# Patient Record
Sex: Male | Born: 1955 | Hispanic: No | Marital: Married | Smoking: Former smoker
Health system: Southern US, Community
[De-identification: ages and names within clinical notes are randomized; demographics above are authoritative.]

## PROBLEM LIST (undated history)

## (undated) DIAGNOSIS — K219 Gastro-esophageal reflux disease without esophagitis: Secondary | ICD-10-CM

## (undated) DIAGNOSIS — E785 Hyperlipidemia, unspecified: Secondary | ICD-10-CM

## (undated) DIAGNOSIS — I10 Essential (primary) hypertension: Secondary | ICD-10-CM

## (undated) DIAGNOSIS — M1990 Unspecified osteoarthritis, unspecified site: Secondary | ICD-10-CM

## (undated) DIAGNOSIS — E119 Type 2 diabetes mellitus without complications: Secondary | ICD-10-CM

## (undated) HISTORY — PX: COLONOSCOPY: SHX174

---

## 2004-10-06 ENCOUNTER — Ambulatory Visit: Payer: Self-pay | Admitting: Surgery

## 2005-11-15 IMAGING — CR DG CHEST 2V
1 series · 2 of 2 positions shown · non-contrast
Comparison: none

REASON FOR EXAM: cough and expectoration (telephone results to physician)
COMMENTS:

PROCEDURE:     DXR - DXR CHEST PA (OR AP) AND LATERAL  - October 06, 2004  [DATE]
RESULT:       PA and lateral views of the chest show the lungs to be clear
and well expanded without evidence of infiltrates, effusions or mass.

[Series 1: view not recorded · 0.17mm/px · 2 of 2 slices shown]
[im 1/2]
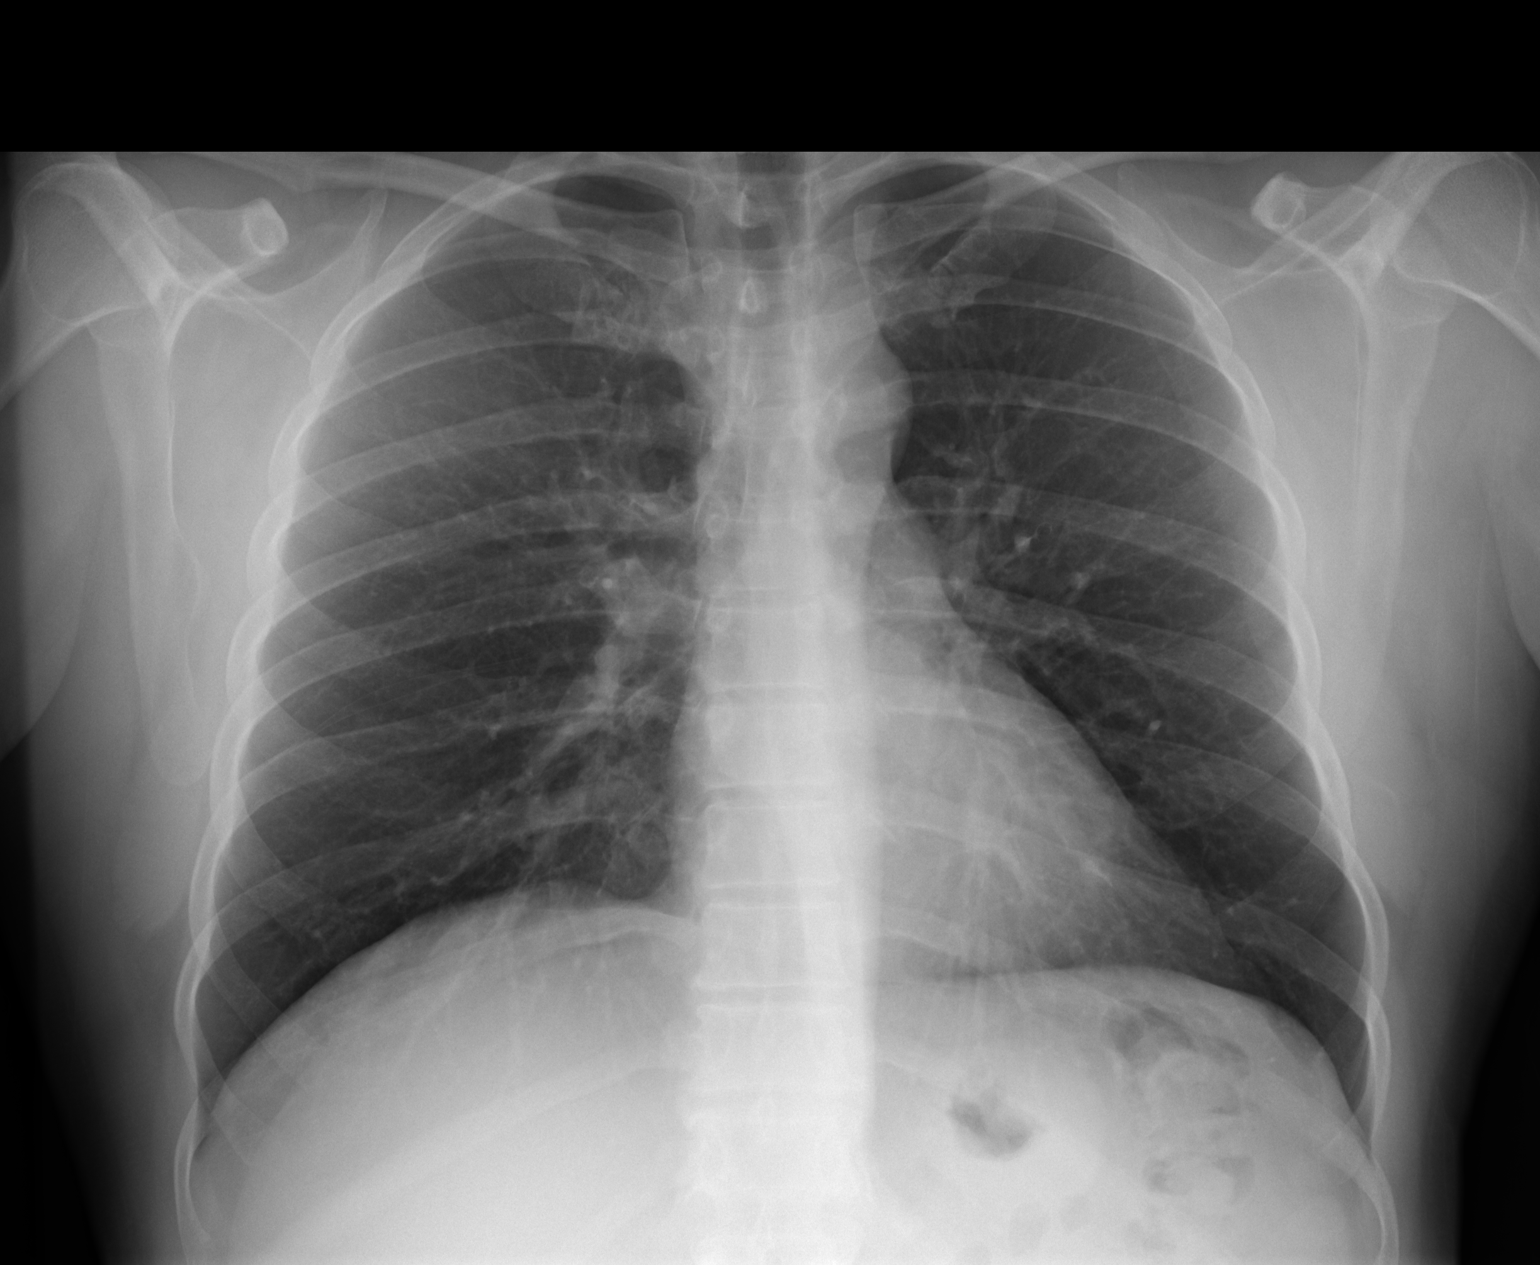
[im 2/2]
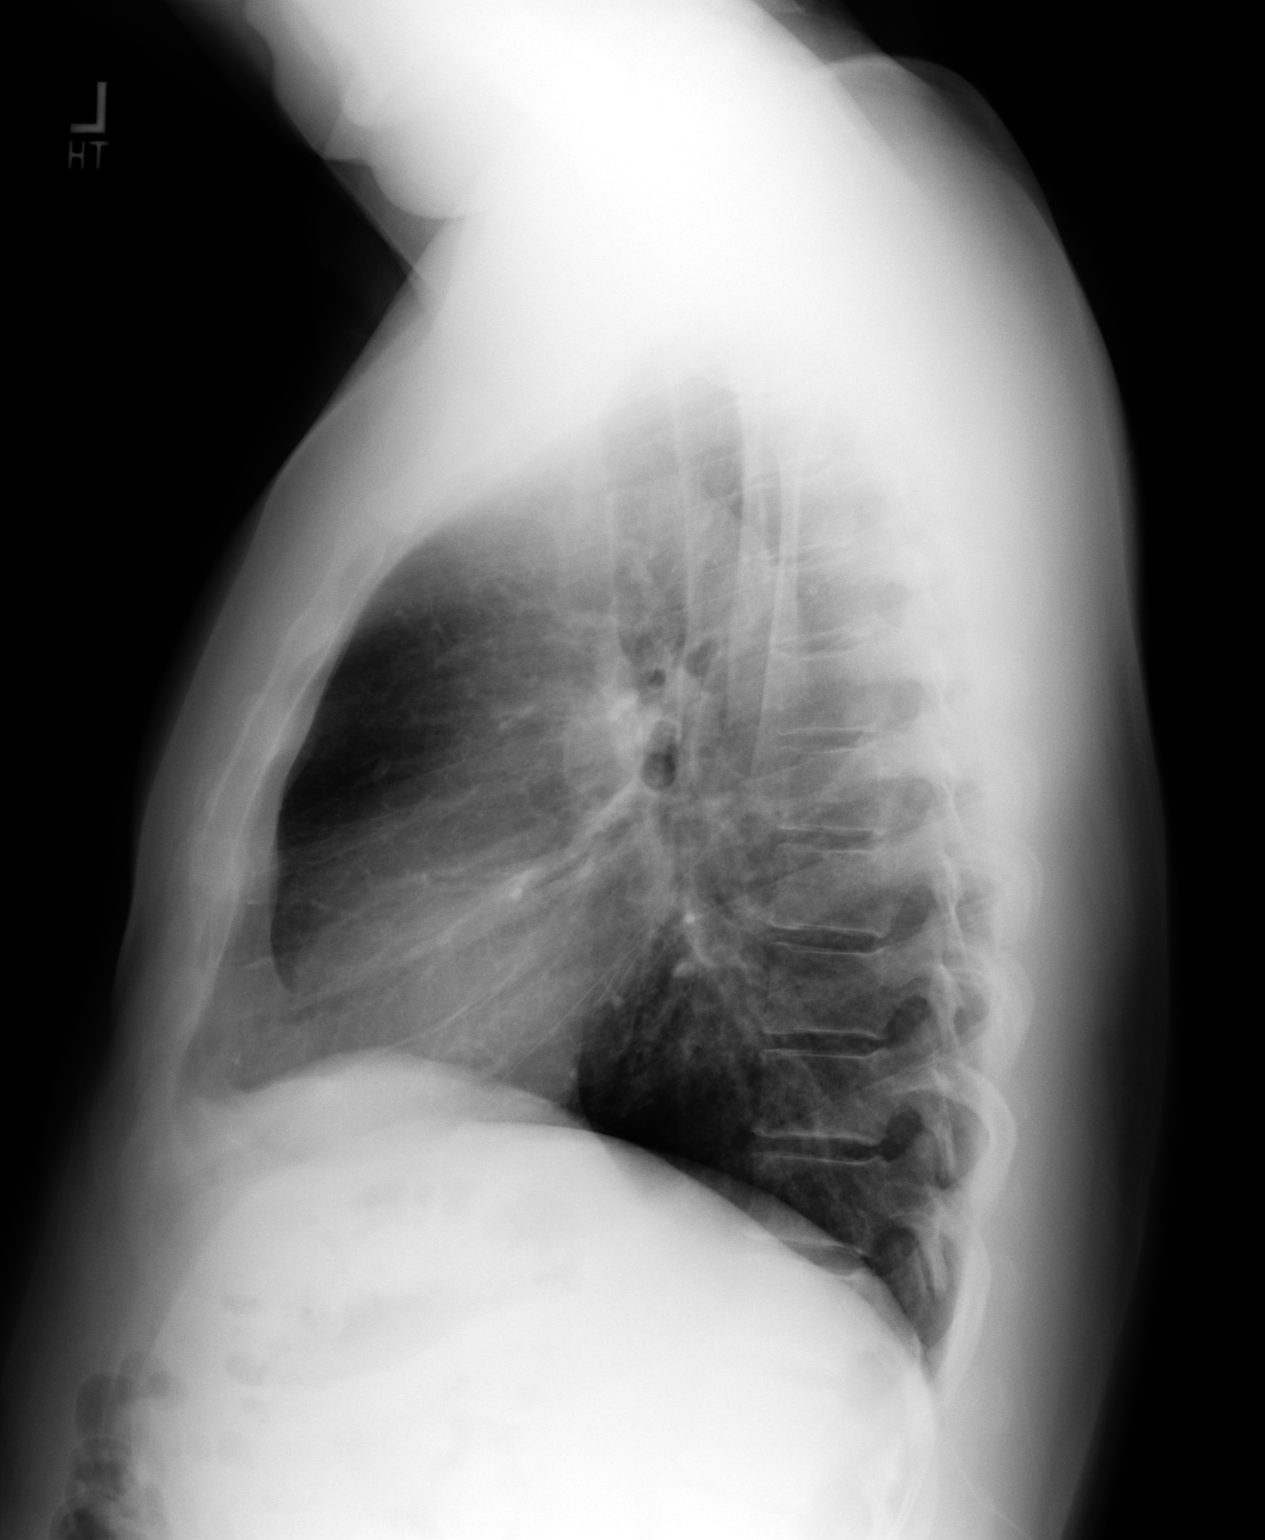

[2 of 2 positions shown; findings below may reference images not displayed]

IMPRESSION: No acute pulmonary disease.

## 2006-02-01 ENCOUNTER — Ambulatory Visit (HOSPITAL_COMMUNITY): Admission: RE | Admit: 2006-02-01 | Discharge: 2006-02-01 | Payer: Self-pay | Admitting: Orthopedic Surgery

## 2007-03-13 IMAGING — CR DG ORBITS FOR FOREIGN BODY
2 series · 2 of 2 positions shown · non-contrast
Comparison: none

CLINICAL DATA: Metal exposure.   Pre-MRI orbital screening.  
 ORBITS FOR FOREIGN BODY -   VIEW:
 There is no evidence of metallic foreign body within the orbits.  No significant bone abnormality identified.

[view not recorded (1 of 2)]
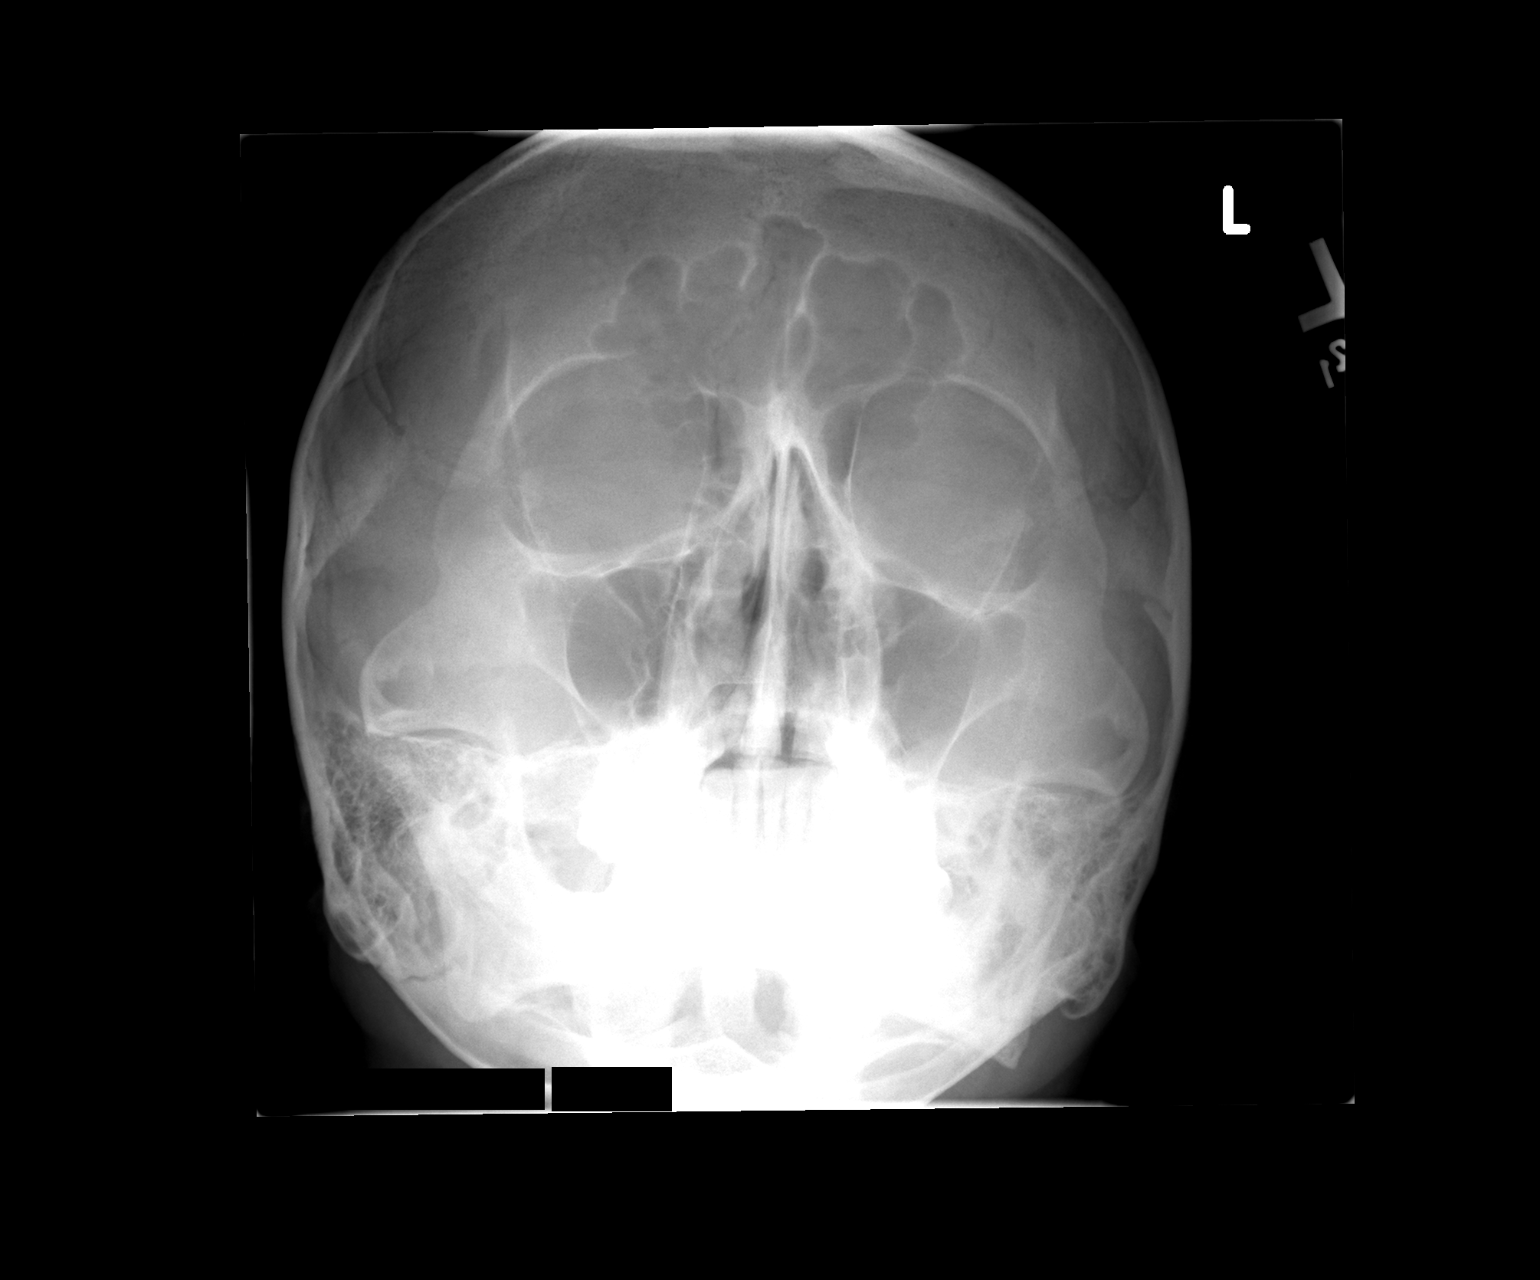

[view not recorded (2 of 2)]
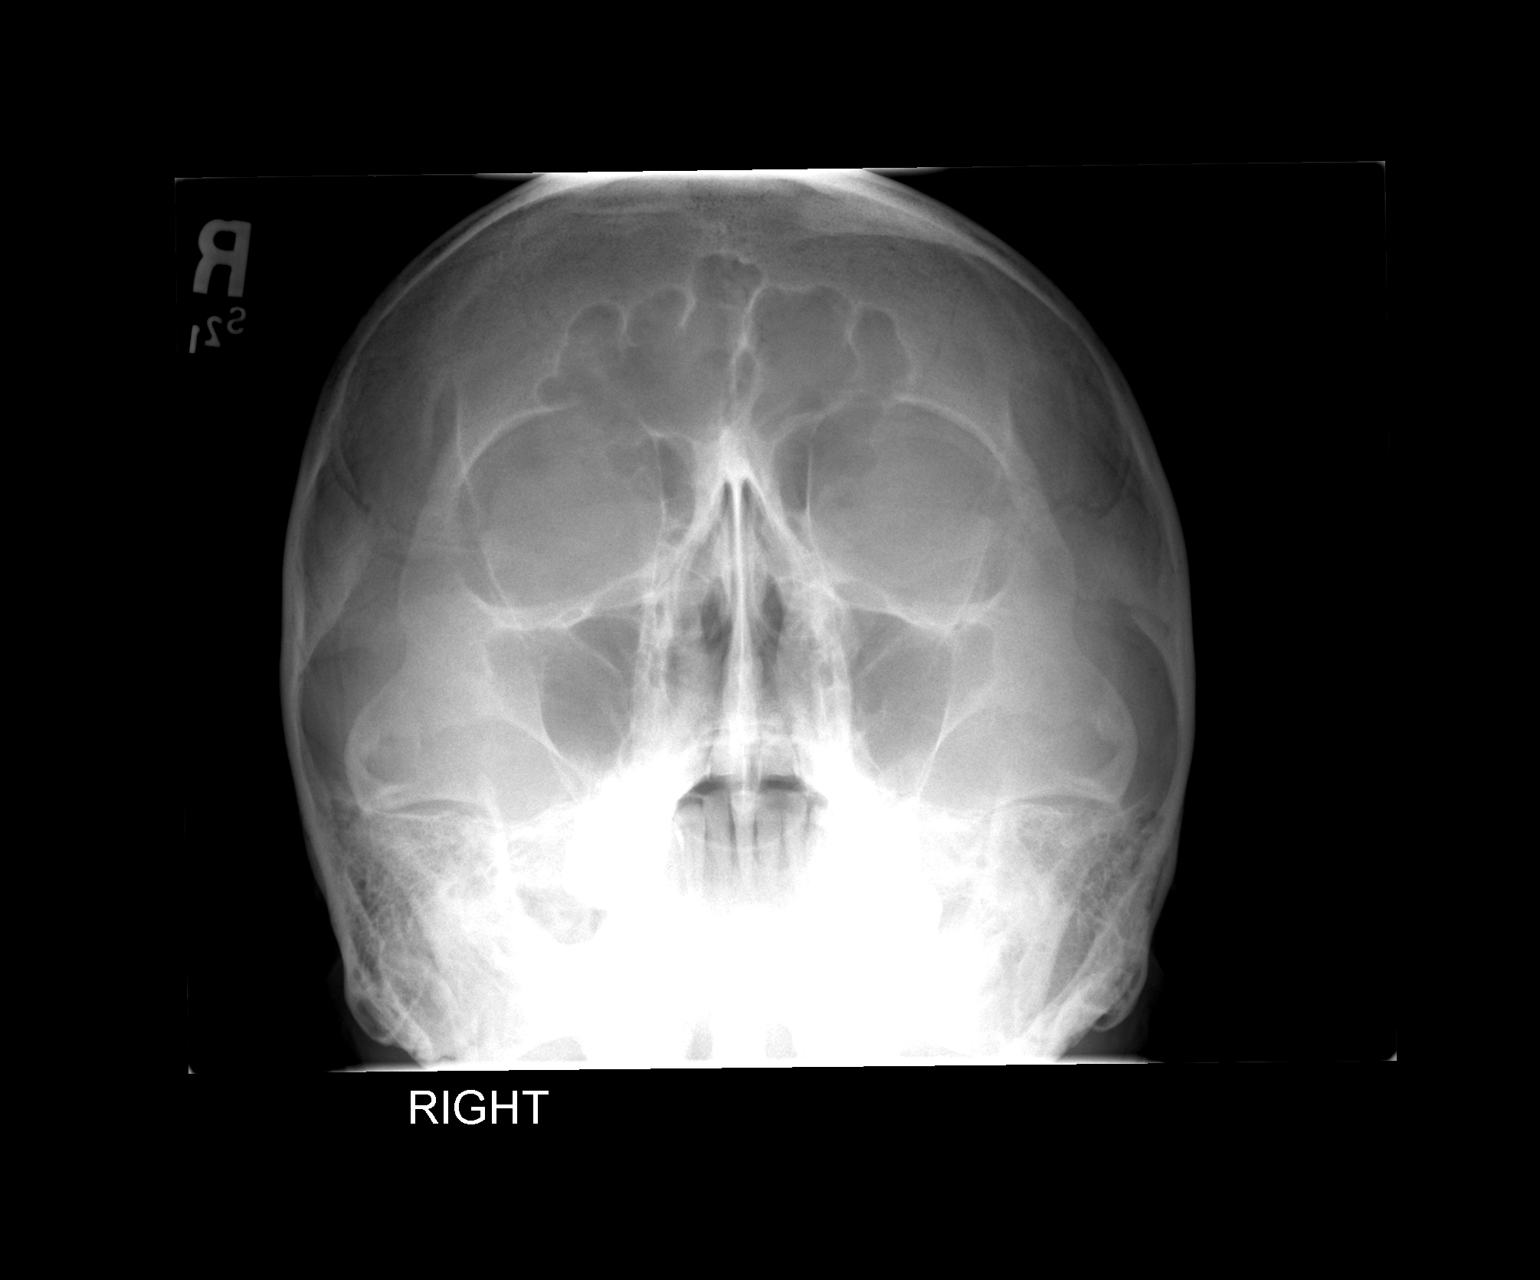

[2 of 2 positions shown; findings below may reference images not displayed]

IMPRESSION: No evidence of metallic foreign body within the orbits.

## 2007-07-25 ENCOUNTER — Ambulatory Visit: Payer: Self-pay | Admitting: Gastroenterology

## 2016-05-24 ENCOUNTER — Encounter (HOSPITAL_COMMUNITY): Payer: Self-pay | Admitting: Emergency Medicine

## 2016-05-24 ENCOUNTER — Emergency Department (HOSPITAL_COMMUNITY)
Admission: EM | Admit: 2016-05-24 | Discharge: 2016-05-24 | Disposition: A | Discharge status: Home / Self Care | Payer: BLUE CROSS/BLUE SHIELD | Attending: Emergency Medicine | Admitting: Emergency Medicine

## 2016-05-24 DIAGNOSIS — R112 Nausea with vomiting, unspecified: Secondary | ICD-10-CM | POA: Insufficient documentation

## 2016-05-24 DIAGNOSIS — E119 Type 2 diabetes mellitus without complications: Secondary | ICD-10-CM | POA: Insufficient documentation

## 2016-05-24 DIAGNOSIS — Z79899 Other long term (current) drug therapy: Secondary | ICD-10-CM | POA: Diagnosis not present

## 2016-05-24 DIAGNOSIS — I10 Essential (primary) hypertension: Secondary | ICD-10-CM | POA: Insufficient documentation

## 2016-05-24 DIAGNOSIS — Z7984 Long term (current) use of oral hypoglycemic drugs: Secondary | ICD-10-CM | POA: Diagnosis not present

## 2016-05-24 HISTORY — DX: Type 2 diabetes mellitus without complications: E11.9

## 2016-05-24 HISTORY — DX: Essential (primary) hypertension: I10

## 2016-05-24 HISTORY — DX: Hyperlipidemia, unspecified: E78.5

## 2016-05-24 HISTORY — DX: Unspecified osteoarthritis, unspecified site: M19.90

## 2016-05-24 LAB — COMPREHENSIVE METABOLIC PANEL
ALBUMIN: 5 g/dL (ref 3.5–5.0)
ALT: 56 U/L (ref 17–63)
ANION GAP: 13 (ref 5–15)
AST: 47 U/L — ABNORMAL HIGH (ref 15–41)
Alkaline Phosphatase: 51 U/L (ref 38–126)
BUN: 22 mg/dL — ABNORMAL HIGH (ref 6–20)
CHLORIDE: 101 mmol/L (ref 101–111)
CO2: 22 mmol/L (ref 22–32)
CREATININE: 0.65 mg/dL (ref 0.61–1.24)
Calcium: 9.8 mg/dL (ref 8.9–10.3)
GFR calc non Af Amer: >60 mL/min (ref >60)
GLUCOSE: 196 mg/dL — AB (ref 65–99)
Potassium: 4.2 mmol/L (ref 3.5–5.1)
SODIUM: 136 mmol/L (ref 135–145)
Total Bilirubin: 0.6 mg/dL (ref 0.3–1.2)
Total Protein: 8.6 g/dL — ABNORMAL HIGH (ref 6.5–8.1)

## 2016-05-24 LAB — CBC WITH DIFFERENTIAL/PLATELET
Basophils Absolute: 0 10*3/uL (ref 0.0–0.1)
Basophils Relative: 0 %
Eosinophils Absolute: 0.1 10*3/uL (ref 0.0–0.7)
Eosinophils Relative: 1 %
HCT: 46.9 % (ref 39.0–52.0)
HEMOGLOBIN: 16.3 g/dL (ref 13.0–17.0)
LYMPHS ABS: 2.2 10*3/uL (ref 0.7–4.0)
LYMPHS PCT: 16 %
MCH: 30 pg (ref 26.0–34.0)
MCHC: 34.8 g/dL (ref 30.0–36.0)
MCV: 86.2 fL (ref 78.0–100.0)
MONOS PCT: 4 %
Monocytes Absolute: 0.5 10*3/uL (ref 0.1–1.0)
NEUTROS PCT: 79 %
Neutro Abs: 10.3 10*3/uL — ABNORMAL HIGH (ref 1.7–7.7)
Platelets: 238 10*3/uL (ref 150–400)
RBC: 5.44 MIL/uL (ref 4.22–5.81)
RDW: 13.4 % (ref 11.5–15.5)
WBC: 13.1 10*3/uL — AB (ref 4.0–10.5)

## 2016-05-24 LAB — URINALYSIS, ROUTINE W REFLEX MICROSCOPIC
BACTERIA UA: NONE SEEN
BILIRUBIN URINE: NEGATIVE
Glucose, UA: >=500 mg/dL — AB
Hgb urine dipstick: NEGATIVE
Ketones, ur: 5 mg/dL — AB
LEUKOCYTES UA: NEGATIVE
Nitrite: NEGATIVE
PH: 7 (ref 5.0–8.0)
Protein, ur: 30 mg/dL — AB
SPECIFIC GRAVITY, URINE: 1.033 — AB (ref 1.005–1.030)
SQUAMOUS EPITHELIAL / LPF: NONE SEEN

## 2016-05-24 LAB — I-STAT TROPONIN, ED: Troponin i, poc: 0 ng/mL (ref 0.00–0.08)

## 2016-05-24 LAB — LIPASE, BLOOD: Lipase: 23 U/L (ref 11–51)

## 2016-05-24 LAB — CBG MONITORING, ED
GLUCOSE-CAPILLARY: 196 mg/dL — AB (ref 65–99)
Glucose-Capillary: 151 mg/dL — ABNORMAL HIGH (ref 65–99)

## 2016-05-24 MED ORDER — ONDANSETRON HCL 4 MG/2ML IJ SOLN
4.0000 mg | Freq: Once | INTRAMUSCULAR | Status: AC
  Administered 2016-05-24: 4 mg via INTRAVENOUS
  Filled 2016-05-24: qty 2

## 2016-05-24 MED ORDER — ONDANSETRON HCL 4 MG/2ML IJ SOLN: 4.0000 mg | Freq: Once | INTRAMUSCULAR | Status: DC

## 2016-05-24 MED ORDER — PANTOPRAZOLE SODIUM 40 MG IV SOLR
40.0000 mg | Freq: Once | INTRAVENOUS | Status: AC
  Administered 2016-05-24: 40 mg via INTRAVENOUS
  Filled 2016-05-24: qty 40

## 2016-05-24 MED ORDER — SODIUM CHLORIDE 0.9 % IV BOLUS (SEPSIS)
1000.0000 mL | Freq: Once | INTRAVENOUS | Status: AC
  Administered 2016-05-24: 1000 mL via INTRAVENOUS

## 2016-05-24 MED ORDER — ONDANSETRON HCL 4 MG PO TABS: 4.0000 mg | ORAL_TABLET | Freq: Three times a day (TID) | ORAL | 0 refills | Status: DC | PRN

## 2016-05-24 NOTE — ED Triage Notes (Signed)
Pt reports recurrent nausea, vomiting x 24 hours. Pt stated that he felt anxious yesterday morning. Marland Kitchen He checked his blood sugar this am -184. Vomited  6 x in last 18 hours. Pt is concerned about a possible heart attack. Denies pain at this time

## 2016-05-24 NOTE — Discharge Instructions (Signed)
Read the information below.  Use the prescribed medication as directed.  Please discuss all new medications with your pharmacist.  You may return to the Emergency Department at any time for worsening condition or any new symptoms that concern you.  If you develop high fevers, abdominal pain, uncontrolled vomiting, or are unable to tolerate fluids by mouth, return to the ER for a recheck.

## 2016-05-24 NOTE — ED Triage Notes (Signed)
CBG 196 

## 2016-05-24 NOTE — ED Provider Notes (Signed)
[facility] DEPT Provider Note   CSN: [id number] Arrival date & time: 05/24/16  1029     History   Chief Complaint Chief Complaint  Patient presents with  . Anxiety  . Emesis  . Nausea    HPI Joseph Olsen is a 61 y.o. male.  HPI   Pt with hx HTN, HLD, DM p/w N/V that began yesterday morning.  States he had eaten breakfast, was paying bills around 9am when he suddenly experienced about 5 seconds of panic, feeling like something terrible was happening.  This completely resolved but one hour later he developed nausea and vomiting.  He has since vomited 12 times, is unable to keep anything down.  Has decreased appetite as well.  Emesis is contents of stomach and yellow, no blood.  He denies any fever, CP, SOB, cough, abdominal pain, urinary or bowel changes.  Had normal bowel movement this morning.  He and his wife started googling symptoms and were concerned he might be having a heart attack, wanted to get checked out.   He does have hx indigestion with spicy foods but has not eaten anything spicy recently.  Does take celebrex for his knee and occasional naprosyn for arthralgias.  Drinks ETOH about every other day.  Did stop taking OTC sleep medication three days ago, no other medication changes.   Past Medical History:  Diagnosis Date  . Arthritis   . Diabetes mellitus without complication ([place])   . Hyperlipidemia   . Hypertension     There are no active problems to display for this patient.   History reviewed. No pertinent surgical history.     Home Medications    Prior to Admission medications   Medication Sig Start Date End Date Taking? Authorizing Provider  atorvastatin (LIPITOR) 20 MG tablet Take 20 mg by mouth daily.    Yes Historical Provider, MD  celecoxib (CELEBREX) 200 MG capsule Take 200 mg by mouth daily.    Yes Historical Provider, MD  cholecalciferol (VITAMIN D) 1000 units tablet Take 4,000 Units by mouth daily.   Yes Historical Provider, MD    DiphenhydrAMINE HCl, Sleep, (SLEEP AID) 25 MG CAPS Take 25 mg by mouth at bedtime as needed (sleep).   Yes Historical Provider, MD  glimepiride (AMARYL) 4 MG tablet Take 4 mg by mouth daily with breakfast.    Yes Historical Provider, MD  losartan (COZAAR) 50 MG tablet Take 50 mg by mouth daily.    Yes Historical Provider, MD  metFORMIN (GLUCOPHAGE) 1000 MG tablet Take 1,000 mg by mouth 2 (two) times daily with a meal.   Yes Historical Provider, MD  saxagliptin HCl (ONGLYZA) 5 MG TABS tablet Take 5 mg by mouth daily.    Yes Historical Provider, MD  testosterone cypionate (DEPOTESTOSTERONE CYPIONATE) 200 MG/ML injection Inject 200 mg into the muscle every 28 (twenty-eight) days. For IM use only    Yes Historical Provider, MD  ondansetron (ZOFRAN) 4 MG tablet Take 1 tablet (4 mg total) by mouth every 8 (eight) hours as needed for nausea or vomiting. 05/24/16   Clayton Bibles, PA-C    Family History Family History  Problem Relation Age of Onset  . Cancer Mother   . Cancer Father     Social History Social History  Substance Use Topics  . Smoking status: Never Smoker  . Smokeless tobacco: Never Used  . Alcohol use Yes     Comment: occ     Allergies   Patient has no known allergies.  Review of Systems Review of Systems  All other systems reviewed and are negative.    Physical Exam Updated Vital Signs BP 164/79   Pulse 66   Temp 97.5 F (36.4 C)   Resp 18   Wt 78.9 kg   SpO2 100%   Physical Exam  Constitutional: He appears well-developed and well-nourished. No distress.  HENT:  Head: Normocephalic and atraumatic.  Neck: Neck supple.  Cardiovascular: Normal rate and regular rhythm.   Pulmonary/Chest: Effort normal and breath sounds normal. No respiratory distress. He has no wheezes. He has no rales.  Abdominal: Soft. Bowel sounds are normal. He exhibits no distension and no mass. There is no tenderness. There is no rebound and no guarding.  Neurological: He is alert. He  exhibits normal muscle tone.  Skin: He is not diaphoretic.  Nursing note and vitals reviewed.    ED Treatments / Results  Labs (all labs ordered are listed, but only abnormal results are displayed) Labs Reviewed  URINALYSIS, ROUTINE W REFLEX MICROSCOPIC - Abnormal; Notable for the following:       Result Value   Specific Gravity, Urine 1.033 (*)    Glucose, UA >=500 (*)    Ketones, ur 5 (*)    Protein, ur 30 (*)    All other components within normal limits  COMPREHENSIVE METABOLIC PANEL - Abnormal; Notable for the following:    Glucose, Bld 196 (*)    BUN 22 (*)    Total Protein 8.6 (*)    AST 47 (*)    All other components within normal limits  CBC WITH DIFFERENTIAL/PLATELET - Abnormal; Notable for the following:    WBC 13.1 (*)    Neutro Abs 10.3 (*)    All other components within normal limits  CBG MONITORING, ED - Abnormal; Notable for the following:    Glucose-Capillary 196 (*)    All other components within normal limits  CBG MONITORING, ED - Abnormal; Notable for the following:    Glucose-Capillary 151 (*)    All other components within normal limits  LIPASE, BLOOD  I-STAT TROPOININ, ED    EKG  EKG Interpretation  Date/Time:  Monday May 24 2016 10:43:28 EST Ventricular Rate:  78 PR Interval:    QRS Duration: 100 QT Interval:  379 QTC Calculation: 432 R Axis:   2 Text Interpretation:  Sinus rhythm Low voltage, extremity leads Abnormal R-wave progression, early transition Minimal ST elevation, anterior leads Baseline wander in lead(s) II aVR No old tracing to compare Confirmed by [facility]  MD, ELLIOTT [phone]) on 05/24/2016 1:43:52 PM       Radiology No results found.  Procedures Procedures (including critical care time)  Medications Ordered in ED Medications  ondansetron (ZOFRAN) injection 4 mg (0 mg Intravenous Hold 05/24/16 1413)  sodium chloride 0.9 % bolus 1,000 mL (0 mLs Intravenous Stopped 05/24/16 1413)  ondansetron (ZOFRAN) injection 4 mg (4 mg  Intravenous Given 05/24/16 1405)  pantoprazole (PROTONIX) injection 40 mg (40 mg Intravenous Given 05/24/16 1412)     Initial Impression / Assessment and Plan / ED Course  I have reviewed the triage vital signs and the nursing notes.  Pertinent labs & imaging results that were available during my care of the patient were reviewed by me and considered in my medical decision making (see chart for details).  Clinical Course     Afebrile, nontoxic patient with N/V > 24 hours with preceding episode of anxiety.  Exam reassuring.  EKG nonischemic Labs significant for leukocytosis,  hyperglycemia.  IVF, zofran, protonix given.  No further vomiting.  Tolerating PO.   D/C home with zofran, PCP follow up, return precautions.   Discussed result, findings, treatment, and follow up  with patient.  Pt given return precautions.  Pt verbalizes understanding and agrees with plan.       Final Clinical Impressions(s) / ED Diagnoses   Final diagnoses:  Non-intractable vomiting with nausea, unspecified vomiting type    New Prescriptions New Prescriptions   ONDANSETRON (ZOFRAN) 4 MG TABLET    Take 1 tablet (4 mg total) by mouth every 8 (eight) hours as needed for nausea or vomiting.     Clayton Bibles, PA-C 05/24/16 [place], MD 05/24/16 [phone]

## 2016-08-22 HISTORY — PX: OTHER SURGICAL HISTORY: SHX169

## 2017-06-21 ENCOUNTER — Telehealth (INDEPENDENT_AMBULATORY_CARE_PROVIDER_SITE_OTHER): Payer: Self-pay | Admitting: *Deleted

## 2017-06-21 ENCOUNTER — Encounter (INDEPENDENT_AMBULATORY_CARE_PROVIDER_SITE_OTHER): Payer: Self-pay | Admitting: *Deleted

## 2017-06-21 MED ORDER — PEG-KCL-NACL-NASULF-NA ASC-C 140 G PO SOLR: 1.0000 | Freq: Once | ORAL | 0 refills | Status: AC

## 2017-06-21 NOTE — Telephone Encounter (Signed)
Patient needs plenvu 

## 2017-06-22 ENCOUNTER — Telehealth (INDEPENDENT_AMBULATORY_CARE_PROVIDER_SITE_OTHER): Payer: Self-pay | Admitting: *Deleted

## 2017-06-22 ENCOUNTER — Other Ambulatory Visit (INDEPENDENT_AMBULATORY_CARE_PROVIDER_SITE_OTHER): Payer: Self-pay | Admitting: *Deleted

## 2017-06-22 DIAGNOSIS — K219 Gastro-esophageal reflux disease without esophagitis: Secondary | ICD-10-CM

## 2017-06-22 DIAGNOSIS — Z1211 Encounter for screening for malignant neoplasm of colon: Secondary | ICD-10-CM

## 2017-06-22 NOTE — Telephone Encounter (Signed)
Referring MD/PCP: arvind   Procedure: tcs/egd  Reason/Indication:  Screening/GERD  Has patient had this procedure before?  Yes, 2009 ([facility])  If so, when, by whom and where?    Is there a family history of colon cancer?  no  Who?  What age when diagnosed?    Is patient diabetic?   yes      Does patient have prosthetic heart valve or mechanical valve?  no  Do you have a pacemaker?  no  Has patient ever had endocarditis? no  Has patient had joint replacement within last 12 months?  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 no  Does patient have a history of alcohol/drug use?  no  Is patient on Coumadin, Plavix and/or Aspirin? no  Medications: see epic   Allergies: see epic  Medication Adjustment per Dr Laural Golden: don't take diabetic meds evening before & morning of  Procedure date & time: 07/11/17 at 1030

## 2017-06-23 NOTE — Telephone Encounter (Signed)
agree

## 2017-07-06 ENCOUNTER — Telehealth (INDEPENDENT_AMBULATORY_CARE_PROVIDER_SITE_OTHER): Payer: Self-pay | Admitting: Internal Medicine

## 2017-07-06 ENCOUNTER — Telehealth (INDEPENDENT_AMBULATORY_CARE_PROVIDER_SITE_OTHER): Payer: Self-pay | Admitting: *Deleted

## 2017-07-06 DIAGNOSIS — Z1211 Encounter for screening for malignant neoplasm of colon: Secondary | ICD-10-CM

## 2017-07-06 MED ORDER — PEG 3350-KCL-NA BICARB-NACL 420 G PO SOLR: 4000.0000 mL | Freq: Once | ORAL | 0 refills | Status: AC

## 2017-07-06 NOTE — Telephone Encounter (Signed)
er

## 2017-07-06 NOTE — Telephone Encounter (Signed)
err

## 2017-07-08 ENCOUNTER — Telehealth (INDEPENDENT_AMBULATORY_CARE_PROVIDER_SITE_OTHER): Payer: Self-pay | Admitting: Internal Medicine

## 2017-07-08 MED ORDER — NA SULFATE-K SULFATE-MG SULF 17.5-3.13-1.6 GM/177ML PO SOLN: 1.0000 | Freq: Once | ORAL | 0 refills | Status: AC

## 2017-07-08 NOTE — Telephone Encounter (Signed)
Rx sent 

## 2017-07-11 ENCOUNTER — Ambulatory Visit (HOSPITAL_COMMUNITY)
Admission: RE | Admit: 2017-07-11 | Discharge: 2017-07-11 | Disposition: A | Discharge status: Home / Self Care | Payer: BLUE CROSS/BLUE SHIELD | Source: Ambulatory Visit | Attending: Internal Medicine | Admitting: Internal Medicine

## 2017-07-11 ENCOUNTER — Encounter (HOSPITAL_COMMUNITY): Payer: Self-pay | Admitting: *Deleted

## 2017-07-11 ENCOUNTER — Other Ambulatory Visit: Payer: Self-pay

## 2017-07-11 ENCOUNTER — Encounter (HOSPITAL_COMMUNITY)
Admission: RE | Disposition: A | Discharge status: Home / Self Care | Payer: Self-pay | Source: Ambulatory Visit | Attending: Internal Medicine

## 2017-07-11 DIAGNOSIS — I10 Essential (primary) hypertension: Secondary | ICD-10-CM | POA: Insufficient documentation

## 2017-07-11 DIAGNOSIS — D120 Benign neoplasm of cecum: Secondary | ICD-10-CM | POA: Insufficient documentation

## 2017-07-11 DIAGNOSIS — Z800 Family history of malignant neoplasm of digestive organs: Secondary | ICD-10-CM | POA: Insufficient documentation

## 2017-07-11 DIAGNOSIS — Z7984 Long term (current) use of oral hypoglycemic drugs: Secondary | ICD-10-CM | POA: Diagnosis not present

## 2017-07-11 DIAGNOSIS — Z87891 Personal history of nicotine dependence: Secondary | ICD-10-CM | POA: Insufficient documentation

## 2017-07-11 DIAGNOSIS — Z79899 Other long term (current) drug therapy: Secondary | ICD-10-CM | POA: Insufficient documentation

## 2017-07-11 DIAGNOSIS — K228 Other specified diseases of esophagus: Secondary | ICD-10-CM | POA: Diagnosis not present

## 2017-07-11 DIAGNOSIS — Z1211 Encounter for screening for malignant neoplasm of colon: Secondary | ICD-10-CM | POA: Insufficient documentation

## 2017-07-11 DIAGNOSIS — M1990 Unspecified osteoarthritis, unspecified site: Secondary | ICD-10-CM | POA: Insufficient documentation

## 2017-07-11 DIAGNOSIS — K5730 Diverticulosis of large intestine without perforation or abscess without bleeding: Secondary | ICD-10-CM | POA: Diagnosis not present

## 2017-07-11 DIAGNOSIS — K219 Gastro-esophageal reflux disease without esophagitis: Secondary | ICD-10-CM | POA: Diagnosis not present

## 2017-07-11 DIAGNOSIS — E785 Hyperlipidemia, unspecified: Secondary | ICD-10-CM | POA: Diagnosis not present

## 2017-07-11 DIAGNOSIS — Z96651 Presence of right artificial knee joint: Secondary | ICD-10-CM | POA: Insufficient documentation

## 2017-07-11 DIAGNOSIS — K648 Other hemorrhoids: Secondary | ICD-10-CM | POA: Diagnosis not present

## 2017-07-11 DIAGNOSIS — E119 Type 2 diabetes mellitus without complications: Secondary | ICD-10-CM | POA: Insufficient documentation

## 2017-07-11 HISTORY — PX: POLYPECTOMY: SHX5525

## 2017-07-11 HISTORY — PX: ESOPHAGOGASTRODUODENOSCOPY: SHX5428

## 2017-07-11 HISTORY — PX: COLONOSCOPY: SHX5424

## 2017-07-11 HISTORY — DX: Gastro-esophageal reflux disease without esophagitis: K21.9

## 2017-07-11 LAB — GLUCOSE, CAPILLARY: Glucose-Capillary: 118 mg/dL — ABNORMAL HIGH (ref 65–99)

## 2017-07-11 SURGERY — EGD (ESOPHAGOGASTRODUODENOSCOPY)
Anesthesia: Moderate Sedation

## 2017-07-11 MED ORDER — MEPERIDINE HCL 50 MG/ML IJ SOLN
INTRAMUSCULAR | Status: AC
  Filled 2017-07-11: qty 1

## 2017-07-11 MED ORDER — LIDOCAINE VISCOUS 2 % MT SOLN
OROMUCOSAL | Status: AC
  Filled 2017-07-11: qty 15

## 2017-07-11 MED ORDER — SODIUM CHLORIDE 0.9 % IV SOLN
INTRAVENOUS | Status: DC
  Administered 2017-07-11: 09:00:00 via INTRAVENOUS

## 2017-07-11 MED ORDER — LIDOCAINE VISCOUS 2 % MT SOLN
OROMUCOSAL | Status: DC | PRN
  Administered 2017-07-11: 4 mL via OROMUCOSAL

## 2017-07-11 MED ORDER — MIDAZOLAM HCL 5 MG/5ML IJ SOLN
INTRAMUSCULAR | Status: AC
  Filled 2017-07-11: qty 10

## 2017-07-11 MED ORDER — OMEPRAZOLE-SODIUM BICARBONATE 20-1100 MG PO CAPS: 1.0000 | ORAL_CAPSULE | Freq: Every day | ORAL | 0 refills | Status: AC | PRN

## 2017-07-11 MED ORDER — MIDAZOLAM HCL 5 MG/5ML IJ SOLN
INTRAMUSCULAR | Status: DC | PRN
  Administered 2017-07-11 (×2): 2 mg via INTRAVENOUS
  Administered 2017-07-11: 1 mg via INTRAVENOUS
  Administered 2017-07-11: 2 mg via INTRAVENOUS
  Administered 2017-07-11: 1 mg via INTRAVENOUS
  Administered 2017-07-11: 2 mg via INTRAVENOUS

## 2017-07-11 MED ORDER — STERILE WATER FOR IRRIGATION IR SOLN
Status: DC | PRN
  Administered 2017-07-11: 10:00:00

## 2017-07-11 MED ORDER — MEPERIDINE HCL 50 MG/ML IJ SOLN
INTRAMUSCULAR | Status: DC | PRN
  Administered 2017-07-11 (×2): 25 mg via INTRAVENOUS

## 2017-07-11 NOTE — Discharge Instructions (Signed)
Zegerid OTC 20 mg daily as needed. Resume celecoxib tomorrow. Resume other medications as before. High-fiber diet. No driving for 24 hours. Physician will call with biopsy results.   Colonoscopy, Adult, Care After This sheet gives you information about how to care for yourself after your procedure. Your doctor may also give you more specific instructions. If you have problems or questions, call your doctor. Follow these instructions at home: General instructions   For the first 24 hours after the procedure: ? Do not drive or use machinery. ? Do not sign important documents. ? Do not drink alcohol. ? Do your daily activities more slowly than normal. ? Eat foods that are soft and easy to digest. ? Rest often.  Take over-the-counter or prescription medicines only as told by your doctor.  It is up to you to get the results of your procedure. Ask your doctor, or the department performing the procedure, when your results will be ready. To help cramping and bloating:  Try walking around.  Put heat on your belly (abdomen) as told by your doctor. Use a heat source that your doctor recommends, such as a moist heat pack or a heating pad. ? Put a towel between your skin and the heat source. ? Leave the heat on for 20-30 minutes. ? Remove the heat if your skin turns bright red. This is especially important if you cannot feel pain, heat, or cold. You can get burned. Eating and drinking  Drink enough fluid to keep your pee (urine) clear or pale yellow.  Return to your normal diet as told by your doctor. Avoid heavy or fried foods that are hard to digest.  Avoid drinking alcohol for as long as told by your doctor. Contact a doctor if:  You have blood in your poop (stool) 2-3 days after the procedure. Get help right away if:  You have more than a small amount of blood in your poop.  You see large clumps of tissue (blood clots) in your poop.  Your belly is swollen.  You feel sick to  your stomach (nauseous).  You throw up (vomit).  You have a fever.  You have belly pain that gets worse, and medicine does not help your pain. This information is not intended to replace advice given to you by your health care provider. Make sure you discuss any questions you have with your health care provider. Document Released: 06/12/2010 Document Revised: 02/02/2016 Document Reviewed: 02/02/2016 Elsevier Interactive Patient Education  2017 [employer].  Colon Polyps Polyps are tissue growths inside the body. Polyps can grow in many places, including the large intestine (colon). A polyp may be a round bump or a mushroom-shaped growth. You could have one polyp or several. Most colon polyps are noncancerous (benign). However, some colon polyps can become cancerous over time. What are the causes? The exact cause of colon polyps is not known. What increases the risk? This condition is more likely to develop in people who:  Have a family history of colon cancer or colon polyps.  Are older than 57 or older than 45 if they are African American.  Have inflammatory bowel disease, such as ulcerative colitis or Crohn disease.  Are overweight.  Smoke cigarettes.  Do not get enough exercise.  Drink too much alcohol.  Eat a diet that is: ? High in fat and red meat. ? Low in fiber.  Had childhood cancer that was treated with abdominal radiation.  What are the signs or symptoms? Most polyps do  not cause symptoms. If you have symptoms, they may include:  Blood coming from your rectum when having a bowel movement.  Blood in your stool.The stool may look dark red or black.  A change in bowel habits, such as constipation or diarrhea.  How is this diagnosed? This condition is diagnosed with a colonoscopy. This is a procedure that uses a lighted, flexible scope to look at the inside of your colon. How is this treated? Treatment for this condition involves removing any polyps that  are found. Those polyps will then be tested for cancer. If cancer is found, your health care provider will talk to you about options for colon cancer treatment. Follow these instructions at home: Diet  Eat plenty of fiber, such as fruits, vegetables, and whole grains.  Eat foods that are high in calcium and vitamin D, such as milk, cheese, yogurt, eggs, liver, fish, and broccoli.  Limit foods high in fat, red meats, and processed meats, such as hot dogs, sausage, bacon, and lunch meats.  Maintain a healthy weight, or lose weight if recommended by your health care provider. General instructions  Do not smoke cigarettes.  Do not drink alcohol excessively.  Keep all follow-up visits as told by your health care provider. This is important. This includes keeping regularly scheduled colonoscopies. Talk to your health care provider about when you need a colonoscopy.  Exercise every day or as told by your health care provider. Contact a health care provider if:  You have new or worsening bleeding during a bowel movement.  You have new or increased blood in your stool.  You have a change in bowel habits.  You unexpectedly lose weight. This information is not intended to replace advice given to you by your health care provider. Make sure you discuss any questions you have with your health care provider. Document Released: 02/04/2004 Document Revised: 10/16/2015 Document Reviewed: 03/31/2015 Elsevier Interactive Patient Education  2018 [employer].  Hemorrhoids Hemorrhoids are swollen veins in and around the rectum or anus. There are two types of hemorrhoids:  Internal hemorrhoids. These occur in the veins that are just inside the rectum. They may poke through to the outside and become irritated and painful.  External hemorrhoids. These occur in the veins that are outside of the anus and can be felt as a painful swelling or hard lump near the anus.  Most hemorrhoids do not cause  serious problems, and they can be managed with home treatments such as diet and lifestyle changes. If home treatments do not help your symptoms, procedures can be done to shrink or remove the hemorrhoids. What are the causes? This condition is caused by increased pressure in the anal area. This pressure may result from various things, including:  Constipation.  Straining to have a bowel movement.  Diarrhea.  Pregnancy.  Obesity.  Sitting for long periods of time.  Heavy lifting or other activity that causes you to strain.  Anal sex.  What are the signs or symptoms? Symptoms of this condition include:  Pain.  Anal itching or irritation.  Rectal bleeding.  Leakage of stool (feces).  Anal swelling.  One or more lumps around the anus.  How is this diagnosed? This condition can often be diagnosed through a visual exam. Other exams or tests may also be done, such as:  Examination of the rectal area with a gloved hand (digital rectal exam).  Examination of the anal canal using a small tube (anoscope).  A blood test, if you  have lost a significant amount of blood.  A test to look inside the colon (sigmoidoscopy or colonoscopy).  How is this treated? This condition can usually be treated at home. However, various procedures may be done if dietary changes, lifestyle changes, and other home treatments do not help your symptoms. These procedures can help make the hemorrhoids smaller or remove them completely. Some of these procedures involve surgery, and others do not. Common procedures include:  Rubber band ligation. Rubber bands are placed at the base of the hemorrhoids to cut off the blood supply to them.  Sclerotherapy. Medicine is injected into the hemorrhoids to shrink them.  Infrared coagulation. A type of light energy is used to get rid of the hemorrhoids.  Hemorrhoidectomy surgery. The hemorrhoids are surgically removed, and the veins that supply them are tied  off.  Stapled hemorrhoidopexy surgery. A circular stapling device is used to remove the hemorrhoids and use staples to cut off the blood supply to them.  Follow these instructions at home: Eating and drinking  Eat foods that have a lot of fiber in them, such as whole grains, beans, nuts, fruits, and vegetables. Ask your health care provider about taking products that have added fiber (fiber supplements).  Drink enough fluid to keep your urine clear or pale yellow. Managing pain and swelling  Take warm sitz baths for 20 minutes, 3-4 times a day to ease pain and discomfort.  If directed, apply ice to the affected area. Using ice packs between sitz baths may be helpful. ? Put ice in a plastic bag. ? Place a towel between your skin and the bag. ? Leave the ice on for 20 minutes, 2-3 times a day. General instructions  Take over-the-counter and prescription medicines only as told by your health care provider.  Use medicated creams or suppositories as told.  Exercise regularly.  Go to the bathroom when you have the urge to have a bowel movement. Do not wait.  Avoid straining to have bowel movements.  Keep the anal area dry and clean. Use wet toilet paper or moist towelettes after a bowel movement.  Do not sit on the toilet for long periods of time. This increases blood pooling and pain. Contact a health care provider if:  You have increasing pain and swelling that are not controlled by treatment or medicine.  You have uncontrolled bleeding.  You have difficulty having a bowel movement, or you are unable to have a bowel movement.  You have pain or inflammation outside the area of the hemorrhoids. This information is not intended to replace advice given to you by your health care provider. Make sure you discuss any questions you have with your health care provider. Document Released: 05/07/2000 Document Revised: 10/08/2015 Document Reviewed: 01/22/2015 Elsevier Interactive Patient  Education  2018 [employer].  Diverticulosis Diverticulosis is a condition that develops when small pouches (diverticula) form in the wall of the large intestine (colon). The colon is where water is absorbed and stool is formed. The pouches form when the inside layer of the colon pushes through weak spots in the outer layers of the colon. You may have a few pouches or many of them. What are the causes? The cause of this condition is not known. What increases the risk? The following factors may make you more likely to develop this condition:  Being older than age 61. Your risk for this condition increases with age. Diverticulosis is rare among people younger than age 65. By age 71, 12  people have it.  Eating a low-fiber diet.  Having frequent constipation.  Being overweight.  Not getting enough exercise.  Smoking.  Taking over-the-counter pain medicines, like aspirin and ibuprofen.  Having a family history of diverticulosis.  What are the signs or symptoms? In most people, there are no symptoms of this condition. If you do have symptoms, they may include:  Bloating.  Cramps in the abdomen.  Constipation or diarrhea.  Pain in the lower left side of the abdomen.  How is this diagnosed? This condition is most often diagnosed during an exam for other colon problems. Because diverticulosis usually has no symptoms, it often cannot be diagnosed independently. This condition may be diagnosed by:  Using a flexible scope to examine the colon (colonoscopy).  Taking an X-ray of the colon after dye has been put into the colon (barium enema).  Doing a CT scan.  How is this treated? You may not need treatment for this condition if you have never developed an infection related to diverticulosis. If you have had an infection before, treatment may include:  Eating a high-fiber diet. This may include eating more fruits, vegetables, and grains.  Taking a fiber supplement.  Taking a  live bacteria supplement (probiotic).  Taking medicine to relax your colon.  Taking antibiotic medicines.  Follow these instructions at home:  Drink 6-8 glasses of water or more each day to prevent constipation.  Try not to strain when you have a bowel movement.  If you have had an infection before: ? Eat more fiber as directed by your health care provider or your diet and nutrition specialist (dietitian). ? Take a fiber supplement or probiotic, if your health care provider approves.  Take over-the-counter and prescription medicines only as told by your health care provider.  If you were prescribed an antibiotic, take it as told by your health care provider. Do not stop taking the antibiotic even if you start to feel better.  Keep all follow-up visits as told by your health care provider. This is important. Contact a health care provider if:  You have pain in your abdomen.  You have bloating.  You have cramps.  You have not had a bowel movement in 3 days. Get help right away if:  Your pain gets worse.  Your bloating becomes very bad.  You have a fever or chills, and your symptoms suddenly get worse.  You vomit.  You have bowel movements that are bloody or black.  You have bleeding from your rectum. Summary  Diverticulosis is a condition that develops when small pouches (diverticula) form in the wall of the large intestine (colon).  You may have a few pouches or many of them.  This condition is most often diagnosed during an exam for other colon problems.  If you have had an infection related to diverticulosis, treatment may include increasing the fiber in your diet, taking supplements, or taking medicines. This information is not intended to replace advice given to you by your health care provider. Make sure you discuss any questions you have with your health care provider. Document Released: 02/05/2004 Document Revised: 03/29/2016 Document Reviewed:  03/29/2016 Elsevier Interactive Patient Education  2017 [employer].

## 2017-07-11 NOTE — Op Note (Signed)
[facility] Patient Name: Joseph Olsen Procedure Date: 07/11/2017 8:19 AM MRN: [id number] Date of Birth: 06-07-1955 Attending MD: Hildred Laser , MD CSN: [id number] Age: 62 Admit Type: Outpatient Procedure:                Upper GI endoscopy Indications:              Follow-up of gastro-esophageal reflux disease Providers:                Hildred Laser, MD, Otis Peak B. Sharon Seller, RN, Aram Candela Referring MD:             Aldean Baker. Arvind, MD Medicines:                Lidocaine spray, Meperidine 50 mg IV, Midazolam 6                            mg IV Complications:            No immediate complications. Estimated Blood Loss:     Estimated blood loss: none. Procedure:                Pre-Anesthesia Assessment:                           - Prior to the procedure, a History and Physical                            was performed, and patient medications and                            allergies were reviewed. The patient's tolerance of                            previous anesthesia was also reviewed. The risks                            and benefits of the procedure and the sedation                            options and risks were discussed with the patient.                            All questions were answered, and informed consent                            was obtained. Prior Anticoagulants: The patient                            last took previous NSAID medication 1 day prior to                            the procedure. ASA Grade Assessment: II - A patient  with mild systemic disease. After reviewing the                            risks and benefits, the patient was deemed in                            satisfactory condition to undergo the procedure.                           After obtaining informed consent, the endoscope was                            passed under direct vision. Throughout the   procedure, the patient's blood pressure, pulse, and                            oxygen saturations were monitored continuously. The                            [id number] ([id number]) scope was introduced through the                            mouth, and advanced to the second part of duodenum.                            The upper GI endoscopy was accomplished without                            difficulty. The patient tolerated the procedure                            well. Scope In: 9:38:05 AM Scope Out: 9:42:15 AM Total Procedure Duration: 0 hours 4 minutes 10 seconds  Findings:      The examined esophagus was normal.      The Z-line was irregular and was found 37 cm from the incisors.      The entire examined stomach was normal.      The duodenal bulb and second portion of the duodenum were normal. Impression:               - Normal esophagus.                           - Z-line irregular, 37 cm from the incisors.                           - Normal stomach.                           - Normal duodenal bulb and second portion of the                            duodenum.                           - No specimens collected. Moderate Sedation:  Moderate (conscious) sedation was administered by the endoscopy nurse       and supervised by the endoscopist. The following parameters were       monitored: oxygen saturation, heart rate, blood pressure, CO2       capnography and response to care. Total physician intraservice time was       11 minutes. Recommendation:           - Patient has a contact number available for                            emergencies. The signs and symptoms of potential                            delayed complications were discussed with the                            patient. Return to normal activities tomorrow.                            Written discharge instructions were provided to the                            patient.                           - Resume previous  diet today.                           - Continue present medications.                           - Use Zegerid (omeprazole+bicarb) 20 mg tab by                            mouth daily as needed.. Procedure Code(s):        --- Professional ---                           [phone], Esophagogastroduodenoscopy, flexible,                            transoral; diagnostic, including collection of                            specimen(s) by brushing or washing, when performed                            (separate procedure)                           99152, Moderate sedation services provided by the                            same physician or other qualified health care  professional performing the diagnostic or                            therapeutic service that the sedation supports,                            requiring the presence of an independent trained                            observer to assist in the monitoring of the                            patient's level of consciousness and physiological                            status; initial 15 minutes of intraservice time,                            patient age 56 years or older Diagnosis Code(s):        --- Professional ---                           K22.8, Other specified diseases of esophagus                           K21.9, Gastro-esophageal reflux disease without                            esophagitis CPT copyright 2016 American [employer]. All rights reserved. The codes documented in this report are preliminary and upon coder review may  be revised to meet current compliance requirements. Hildred Laser, MD Hildred Laser, MD 07/11/2017 10:17:02 AM This report has been signed electronically. Number of Addenda: 0

## 2017-07-11 NOTE — Op Note (Signed)
[facility] Patient Name: Joseph Olsen Procedure Date: 07/11/2017 9:42 AM MRN: [id number] Date of Birth: 1955-10-18 Attending MD: Hildred Laser , MD CSN: [id number] Age: 62 Admit Type: Outpatient Procedure:                Colonoscopy Indications:              Screening for colorectal malignant neoplasm Providers:                Hildred Laser, MD, Otis Peak B. Sharon Seller, RN, Aram Candela Referring MD:             Charna Archer, MD Medicines:                Midazolam 4 mg IV Complications:            No immediate complications. Estimated Blood Loss:     Estimated blood loss was minimal. Procedure:                Pre-Anesthesia Assessment:                           - Prior to the procedure, a History and Physical                            was performed, and patient medications and                            allergies were reviewed. The patient's tolerance of                            previous anesthesia was also reviewed. The risks                            and benefits of the procedure and the sedation                            options and risks were discussed with the patient.                            All questions were answered, and informed consent                            was obtained. Prior Anticoagulants: The patient                            last took previous NSAID medication 1 day prior to                            the procedure. ASA Grade Assessment: II - A patient                            with mild systemic disease. After reviewing the  risks and benefits, the patient was deemed in                            satisfactory condition to undergo the procedure.                           After obtaining informed consent, the colonoscope                            was passed under direct vision. Throughout the                            procedure, the patient's blood pressure, pulse, and   oxygen saturations were monitored continuously. The                            [id number] ([id number]) scope was introduced through                            the anus and advanced to the the cecum, identified                            by appendiceal orifice and ileocecal valve. The                            colonoscopy was performed without difficulty. The                            patient tolerated the procedure well. The quality                            of the bowel preparation was good. The ileocecal                            valve, appendiceal orifice, and rectum were                            photographed. Scope In: 9:45:15 AM Scope Out: 10:04:34 AM Scope Withdrawal Time: 0 hours 10 minutes 12 seconds  Total Procedure Duration: 0 hours 19 minutes 19 seconds  Findings:      The perianal and digital rectal examinations were normal.      A small polyp was found in the ileocecal valve. The polyp was sessile.       Biopsies were taken with a cold forceps for histology.      Scattered small-mouthed diverticula were found in the sigmoid colon.      Internal hemorrhoids were found. The hemorrhoids were small. Impression:               - One small polyp at the ileocecal valve. Biopsied.                           - Diverticulosis in the sigmoid colon.                           -  Internal hemorrhoids. Moderate Sedation:      Moderate (conscious) sedation was administered by the endoscopy nurse       and supervised by the endoscopist. The following parameters were       monitored: oxygen saturation, heart rate, blood pressure, CO2       capnography and response to care. Total physician intraservice time was       22 minutes. Recommendation:           - Patient has a contact number available for                            emergencies. The signs and symptoms of potential                            delayed complications were discussed with the                            patient. Return to  normal activities tomorrow.                            Written discharge instructions were provided to the                            patient.                           - High fiber diet today.                           - Continue present medications.                           - Await pathology results.                           - Repeat colonoscopy is recommended. The                            colonoscopy date will be determined after pathology                            results from today's exam become available for                            review. Procedure Code(s):        --- Professional ---                           [id number], Colonoscopy, flexible; with biopsy, single                            or multiple                           99152, Moderate sedation services provided by the  same physician or other qualified health care                            professional performing the diagnostic or                            therapeutic service that the sedation supports,                            requiring the presence of an independent trained                            observer to assist in the monitoring of the                            patient's level of consciousness and physiological                            status; initial 15 minutes of intraservice time,                            patient age 82 years or older Diagnosis Code(s):        --- Professional ---                           Z12.11, Encounter for screening for malignant                            neoplasm of colon                           D12.0, Benign neoplasm of cecum                           K64.8, Other hemorrhoids                           K57.30, Diverticulosis of large intestine without                            perforation or abscess without bleeding CPT copyright 2016 American Medical Association. All rights reserved. The codes documented in this report are preliminary and upon  coder review may  be revised to meet current compliance requirements. Hildred Laser, MD Hildred Laser, MD 07/11/2017 10:22:43 AM This report has been signed electronically. Number of Addenda: 0

## 2017-07-11 NOTE — H&P (Signed)
Joseph SCHLAFER is an 62 y.o. male.   Chief Complaint: Patient is here for EGD and colonoscopy. HPI: Patient is 62 year old male who has chronic GERD and presently on Synthroid on an as-needed basis.  He is undergoing diagnostic EGD.  He denies dysphagia nausea or vomiting.  He is also undergoing screening colonoscopy.  Last exam was 10 years ago.  He denies change in bowel habits rectal bleeding or melena. Family history is negative for CRC.  Past Medical History:  Diagnosis Date  . Arthritis   . Diabetes mellitus without complication ([place])   . GERD (gastroesophageal reflux disease)   . Hyperlipidemia   . Hypertension     Past Surgical History:  Procedure Laterality Date  . COLONOSCOPY    . Right knee replacement  08/2016    Family History  Problem Relation Age of Onset  . Cancer Mother   . Liver cancer Mother   . Cancer Father   . Esophageal cancer Father   . Colon cancer Neg Hx    Social History:  reports that he quit smoking about 27 years ago. he has never used smokeless tobacco. He reports that he drinks alcohol. He reports that he does not use drugs.  Allergies: No Known Allergies  Medications Prior to Admission  Medication Sig Dispense Refill  . atorvastatin (LIPITOR) 20 MG tablet Take 20 mg by mouth daily.     . celecoxib (CELEBREX) 200 MG capsule Take 200 mg by mouth daily.     . cholecalciferol (VITAMIN D) 1000 units tablet Take 4,000 Units by mouth daily.    . DiphenhydrAMINE HCl, Sleep, (SLEEP AID) 25 MG CAPS Take 25 mg by mouth at bedtime as needed (sleep).    Marland Kitchen glimepiride (AMARYL) 4 MG tablet Take 8 mg by mouth daily with breakfast.     . losartan (COZAAR) 50 MG tablet Take 50 mg by mouth daily.     . metFORMIN (GLUCOPHAGE) 1000 MG tablet Take 1,000 mg by mouth 2 (two) times daily with a meal.    . Omega-3 Fatty Acids (FISH OIL) 1000 MG CAPS Take 1,000 mg by mouth at bedtime.    . saxagliptin HCl (ONGLYZA) 5 MG TABS tablet Take 5 mg by mouth daily.     Marland Kitchen  testosterone cypionate (DEPOTESTOSTERONE CYPIONATE) 200 MG/ML injection Inject 200 mg into the muscle every 28 (twenty-eight) days. For IM use only     . ondansetron (ZOFRAN) 4 MG tablet Take 1 tablet (4 mg total) by mouth every 8 (eight) hours as needed for nausea or vomiting. (Patient not taking: Reported on 06/29/2017) 15 tablet 0    Results for orders placed or performed during the hospital encounter of 07/11/17 (from the past 48 hour(s))  Glucose, capillary     Status: Abnormal   Collection Time: 07/11/17  8:41 AM  Result Value Ref Range   Glucose-Capillary 118 (H) 65 - 99 mg/dL   No results found.  ROS  Blood pressure (!) 182/84, pulse 86, temperature 98.2 F (36.8 C), temperature source Oral, resp. rate 15, height 5\' 4"  (1.626 m), weight 172 lb (78 kg), SpO2 100 %. Physical Exam  Constitutional: He appears well-developed and well-nourished.  HENT:  Mouth/Throat: Oropharynx is clear and moist.  Eyes: Conjunctivae are normal. No scleral icterus.  Neck: No thyromegaly present.  Cardiovascular: Normal rate, regular rhythm and normal heart sounds.  No murmur heard. Respiratory: Effort normal and breath sounds normal.  GI: Soft. He exhibits no distension and no mass. There  is no tenderness.  Musculoskeletal: He exhibits no edema.  Lymphadenopathy:    He has no cervical adenopathy.  Neurological: He is alert.  Skin: Skin is warm and dry.     Assessment/Plan Chronic GERD. Diagnostic EGD and average for screening colonoscopy.  Hildred Laser, MD 07/11/2017, 9:25 AM

## 2017-07-14 ENCOUNTER — Encounter (HOSPITAL_COMMUNITY): Payer: Self-pay | Admitting: Internal Medicine

## 2019-06-30 ENCOUNTER — Ambulatory Visit: Payer: Medicare Other | Attending: Internal Medicine

## 2019-06-30 DIAGNOSIS — Z23 Encounter for immunization: Secondary | ICD-10-CM | POA: Insufficient documentation

## 2019-06-30 NOTE — Progress Notes (Signed)
   Covid-19 Vaccination Clinic  Name:  Joseph Olsen    MRN: [id number] DOB: [birth year]  06/30/2019  Joseph Olsen was observed post Covid-19 immunization for 15 minutes without incidence. He was provided with Vaccine Information Sheet and instruction to access the V-Safe system.   Joseph Olsen was instructed to call 911 with any severe reactions post vaccine: Marland Kitchen Difficulty breathing  . Swelling of your face and throat  . A fast heartbeat  . A bad rash all over your body  . Dizziness and weakness    Immunizations Administered    Name Date Dose VIS Date Route   Pfizer COVID-19 Vaccine 06/30/2019  4:06 PM 0.3 mL 05/04/2019 Intramuscular   Manufacturer: [place]   Lot: [id number]   [place]: [id number]

## 2019-07-22 ENCOUNTER — Ambulatory Visit: Payer: BLUE CROSS/BLUE SHIELD

## 2019-07-24 ENCOUNTER — Ambulatory Visit: Payer: Medicare Other | Attending: Internal Medicine

## 2019-07-24 DIAGNOSIS — Z23 Encounter for immunization: Secondary | ICD-10-CM | POA: Insufficient documentation

## 2019-07-24 NOTE — Progress Notes (Signed)
   Covid-19 Vaccination Clinic  Name:  Joseph Olsen    MRN: [id number] DOB: 1956-03-22  07/24/2019  [name] was observed post Covid-19 immunization for 15 minutes without incident. He was provided with Vaccine Information Sheet and instruction to access the V-Safe system.   [name] was instructed to call 911 with any severe reactions post vaccine: Marland Kitchen Difficulty breathing  . Swelling of face and throat  . A fast heartbeat  . A bad rash all over body  . Dizziness and weakness   Immunizations Administered    Name Date Dose VIS Date Route   Pfizer COVID-19 Vaccine 07/24/2019  8:14 AM 0.3 mL 05/04/2019 Intramuscular   Manufacturer: [place]   Lot: [id number]   [place]: [id number]
